# Patient Record
Sex: Male | Born: 1990 | Race: White | Hispanic: No | Marital: Single | Smoking: Current some day smoker
Health system: Southern US, Community
[De-identification: ages and names within clinical notes are randomized; demographics above are authoritative.]

---

## 2015-01-13 ENCOUNTER — Ambulatory Visit
Admit: 2015-01-13 | Disposition: A | Discharge status: Home / Self Care | Payer: Self-pay | Attending: Family Medicine | Admitting: Family Medicine

## 2015-01-13 LAB — RAPID STREP-A WITH REFLX: Micro Text Report: POSITIVE

## 2017-01-20 ENCOUNTER — Encounter (HOSPITAL_COMMUNITY): Payer: Self-pay | Admitting: *Deleted

## 2017-01-20 ENCOUNTER — Emergency Department (HOSPITAL_COMMUNITY)
Admission: EM | Admit: 2017-01-20 | Discharge: 2017-01-20 | Disposition: A | Discharge status: Home / Self Care | Payer: Self-pay | Attending: Emergency Medicine | Admitting: Emergency Medicine

## 2017-01-20 ENCOUNTER — Emergency Department (HOSPITAL_COMMUNITY): Payer: Self-pay

## 2017-01-20 DIAGNOSIS — Y929 Unspecified place or not applicable: Secondary | ICD-10-CM | POA: Insufficient documentation

## 2017-01-20 DIAGNOSIS — X509XXA Other and unspecified overexertion or strenuous movements or postures, initial encounter: Secondary | ICD-10-CM | POA: Insufficient documentation

## 2017-01-20 DIAGNOSIS — Y999 Unspecified external cause status: Secondary | ICD-10-CM | POA: Insufficient documentation

## 2017-01-20 DIAGNOSIS — Y9389 Activity, other specified: Secondary | ICD-10-CM | POA: Insufficient documentation

## 2017-01-20 DIAGNOSIS — S43004A Unspecified dislocation of right shoulder joint, initial encounter: Secondary | ICD-10-CM | POA: Insufficient documentation

## 2017-01-20 DIAGNOSIS — F17200 Nicotine dependence, unspecified, uncomplicated: Secondary | ICD-10-CM | POA: Insufficient documentation

## 2017-01-20 MED ORDER — ETOMIDATE 2 MG/ML IV SOLN
10.0000 mg | Freq: Once | INTRAVENOUS | Status: DC
  Filled 2017-01-20: qty 10

## 2017-01-20 NOTE — ED Triage Notes (Signed)
Pt complains of right shoulder pain since his shoulder "popped out" 20 minutes prior to arrival. Pt dislocated his shoulder 4 days ago and took his arm out of his sling because it was irritating him.

## 2017-01-20 NOTE — ED Provider Notes (Signed)
[facility]-EMERGENCY DEPT Provider Note   CSN: [id number] Arrival date & time: 01/20/17  2002     History   Chief Complaint Chief Complaint  Patient presents with  . Shoulder Injury    HPI Daniel Roach is a 26 y.o. male.  HPI Pt comes in with cc of shoulder injury. Pt reports that he dislocated the shoulder 5 days ago, and removed his arm from the sling as it was bother him. Pt was trying to get up, and put the weight on that extremity and the shoulder popped out. Pt had numbness and tingling initially, but now he only has pain.   History reviewed. No pertinent past medical history.  There are no active problems to display for this patient.   History reviewed. No pertinent surgical history.     Home Medications    Prior to Admission medications   Medication Sig Start Date End Date Taking? Authorizing Provider  HYDROcodone-acetaminophen (NORCO/VICODIN) 5-325 MG tablet Take 2 tablets by mouth every 6 (six) hours as needed for moderate pain or severe pain.   Yes Historical Provider, MD  naproxen (NAPROSYN) 500 MG tablet Take 500 mg by mouth 2 (two) times daily as needed for moderate pain.   Yes Historical Provider, MD    Family History No family history on file.  Social History Social History  Substance Use Topics  . Smoking status: Current Some Day Smoker  . Smokeless tobacco: Never Used  . Alcohol use Yes     Allergies   Amoxicillin   Review of Systems Review of Systems  Constitutional: Positive for activity change.  Musculoskeletal: Positive for arthralgias.  Skin: Negative for wound.  Neurological: Positive for numbness.     Physical Exam Updated Vital Signs BP (!) 157/90 (BP Location: Left Arm)   Pulse 76   Temp 97.9 F (36.6 C) (Oral)   Resp 18   SpO2 98%   Physical Exam  Constitutional: He is oriented to person, place, and time. He appears well-developed.  HENT:  Head: Atraumatic.  Neck: Neck supple.  Cardiovascular: Normal rate  and intact distal pulses.   Pulmonary/Chest: Effort normal.  Neurological: He is alert and oriented to person, place, and time. No cranial nerve deficit.  No numbness or tingling over the R extremity  Skin: Skin is warm.  Nursing note and vitals reviewed.    ED Treatments / Results  Labs (all labs ordered are listed, but only abnormal results are displayed) Labs Reviewed - No data to display  EKG  EKG Interpretation None       Radiology Dg Shoulder Right  Result Date: 01/20/2017 CLINICAL DATA:  Right shoulder dislocation EXAM: RIGHT SHOULDER - 2+ VIEW COMPARISON:  None. FINDINGS: Three views study shows subcoracoid anterior right humeral head dislocation. No associated fracture fragment is evident on the provided films. IMPRESSION: Anterior right shoulder dislocation. Electronically Signed   By: Kennith Center M.D.   On: 01/20/2017 20:28    Procedures Reduction of dislocation Date/Time: 01/20/2017 8:47 PM Performed by: Derwood Kaplan Authorized by: Derwood Kaplan  Consent: Verbal consent obtained. Risks and benefits: risks, benefits and alternatives were discussed Consent given by: patient Patient understanding: patient states understanding of the procedure being performed Required items: required blood products, implants, devices, and special equipment available Patient identity confirmed: verbally with patient and arm band Time out: Immediately prior to procedure a "time out" was called to verify the correct patient, procedure, equipment, support staff and site/side marked as required. Local anesthesia used: no  Anesthesia: Local anesthesia used: no  Sedation: Patient sedated: no Patient tolerance: Patient tolerated the procedure well with no immediate complications    (including critical care time)  Medications Ordered in ED Medications - No data to display   Initial Impression / Assessment and Plan / ED Course  I have reviewed the triage vital signs and the  nursing notes.  Pertinent labs & imaging results that were available during my care of the patient were reviewed by me and considered in my medical decision making (see chart for details).     Pt dislocated his R shoulder. He is neurovascularly intact. Shoulder reduced w/o any need for sedation. Post reduction, pt is still neurovascularly intact. Stable for d//c.   Final Clinical Impressions(s) / ED Diagnoses   Final diagnoses:  Dislocation of right shoulder joint, initial encounter    New Prescriptions New Prescriptions   No medications on file     Derwood Kaplan, MD 01/20/17 2048

## 2017-01-20 NOTE — Discharge Instructions (Signed)
Keep your shoulder in the sling all the time. See Orthopedist as requested - you will need some physical therapy to strengthen the shoulder.  Ice the shoulder tonight.

## 2018-08-08 IMAGING — CR DG SHOULDER 2+V*R*
3 series · 3 of 3 positions shown · non-contrast
Comparison: None.

CLINICAL DATA: Right shoulder dislocation

EXAM:
RIGHT SHOULDER - 2+ VIEW

[x shoulder ap right (1 of 2)]
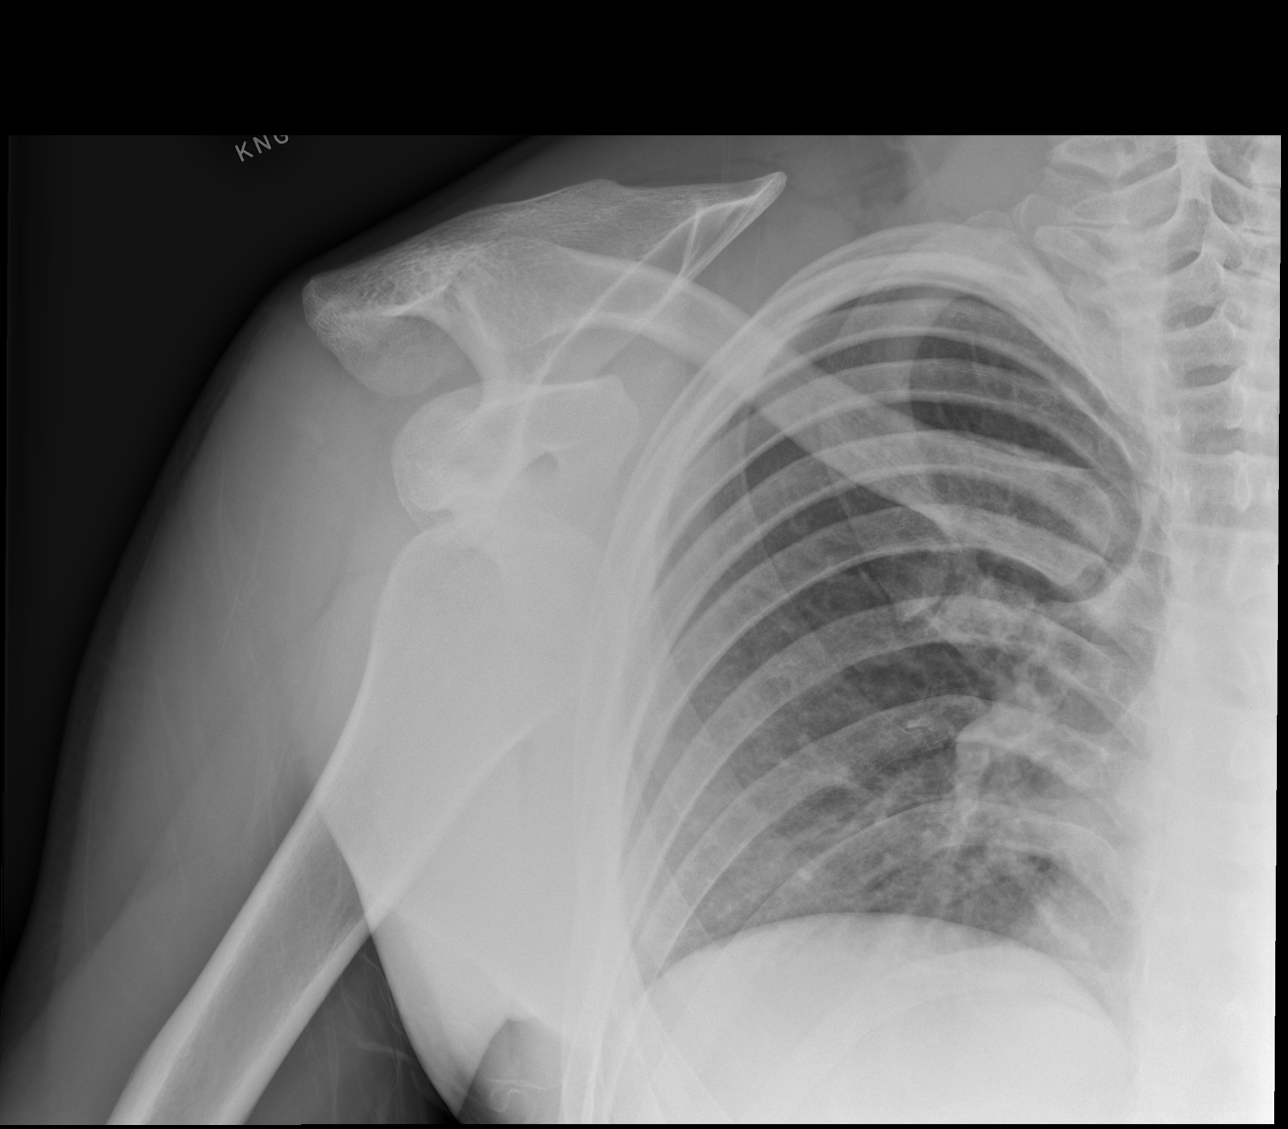

[x shoulder ap right (2 of 2)]
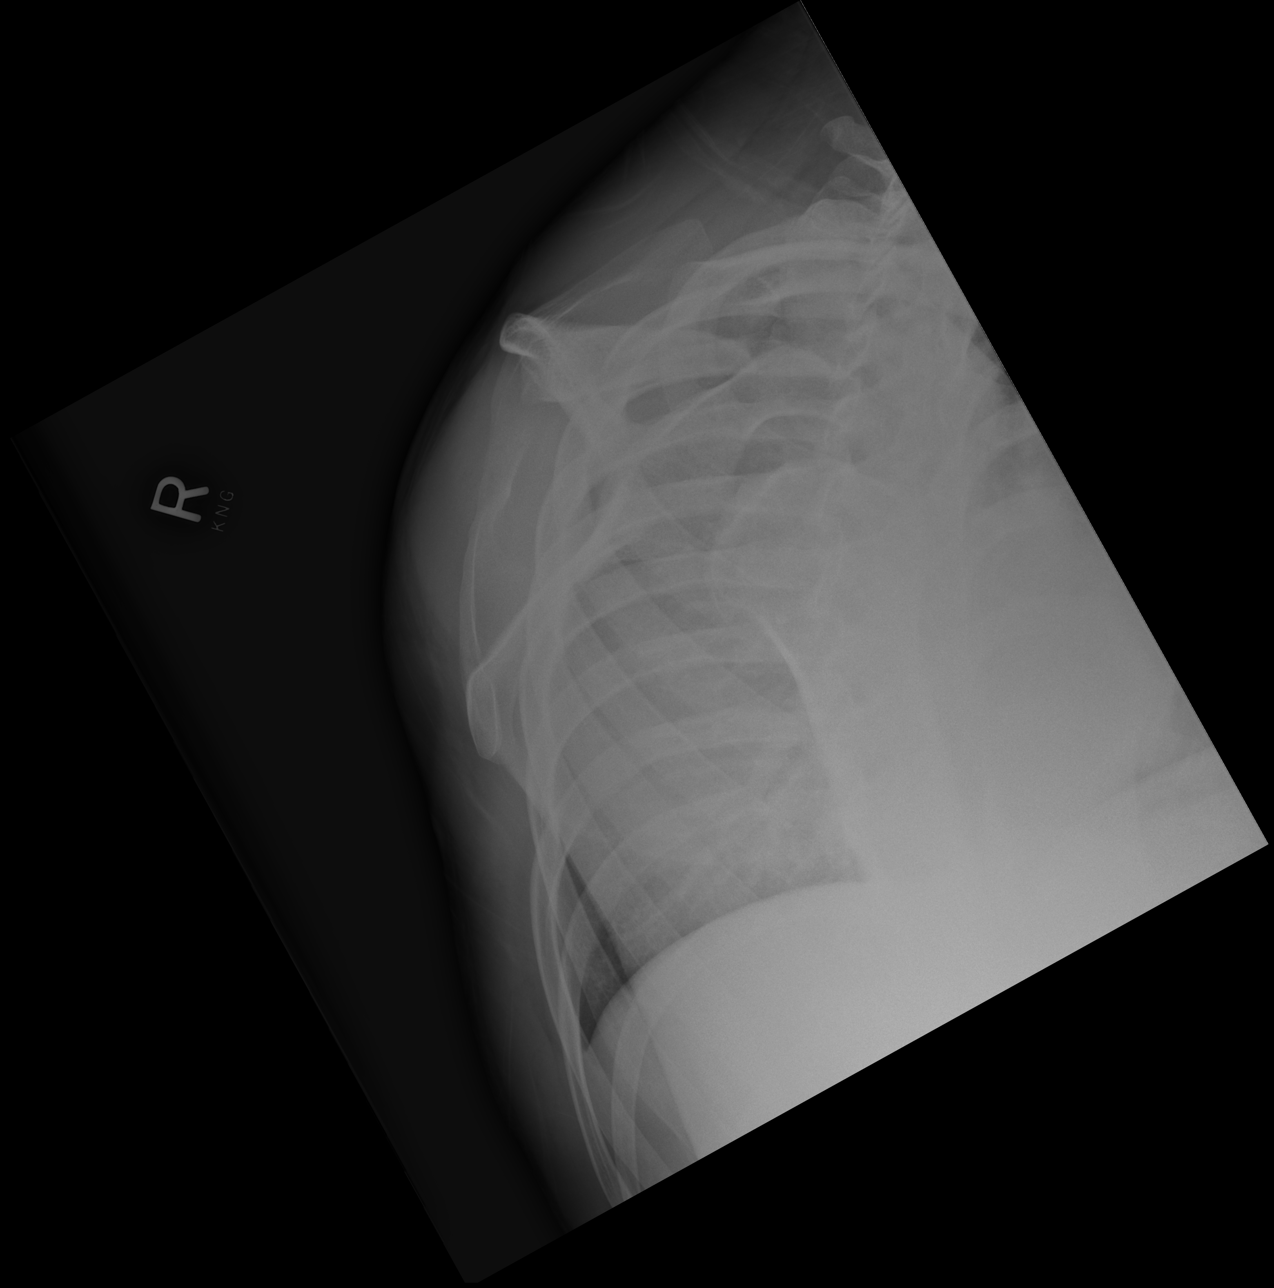

[x shoulder axillary right]
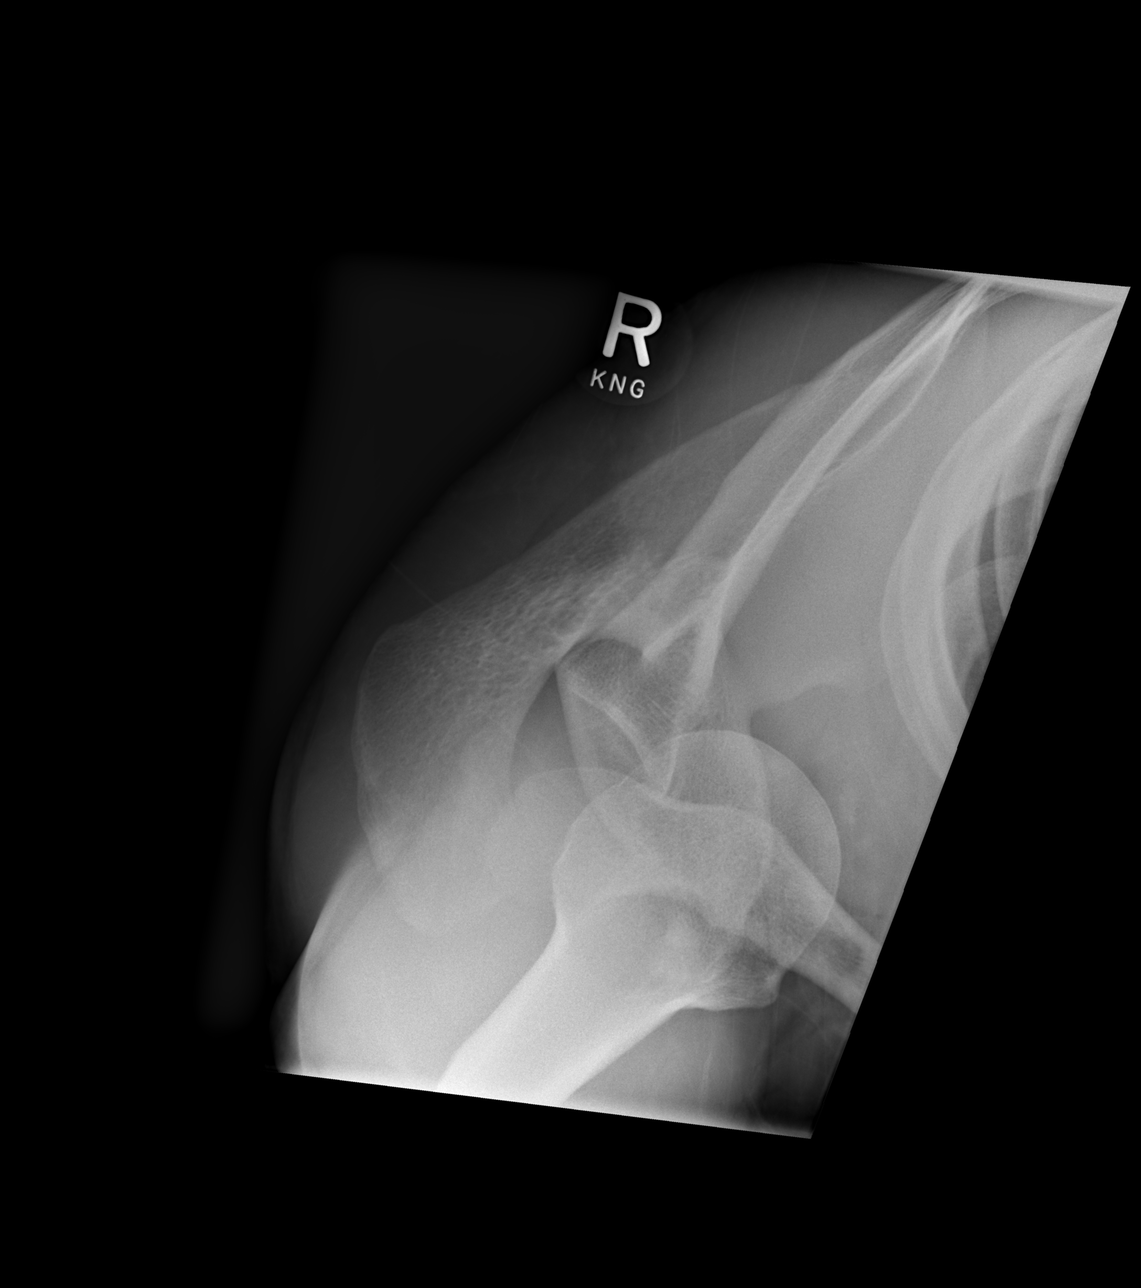

[3 of 3 positions shown; findings below may reference images not displayed]

FINDINGS: Three views study shows subcoracoid anterior right humeral head
dislocation. No associated fracture fragment is evident on the
provided films.
IMPRESSION: Anterior right shoulder dislocation.

## 2020-12-03 DIAGNOSIS — R1031 Right lower quadrant pain: Secondary | ICD-10-CM | POA: Diagnosis not present

## 2020-12-03 DIAGNOSIS — Z6827 Body mass index (BMI) 27.0-27.9, adult: Secondary | ICD-10-CM | POA: Diagnosis not present

## 2020-12-07 DIAGNOSIS — R1031 Right lower quadrant pain: Secondary | ICD-10-CM | POA: Diagnosis not present

## 2020-12-08 DIAGNOSIS — N433 Hydrocele, unspecified: Secondary | ICD-10-CM | POA: Diagnosis not present

## 2021-01-21 DIAGNOSIS — R1031 Right lower quadrant pain: Secondary | ICD-10-CM | POA: Diagnosis not present

## 2021-01-21 DIAGNOSIS — Z6827 Body mass index (BMI) 27.0-27.9, adult: Secondary | ICD-10-CM | POA: Diagnosis not present

## 2021-01-29 DIAGNOSIS — K388 Other specified diseases of appendix: Secondary | ICD-10-CM | POA: Diagnosis not present

## 2021-01-29 DIAGNOSIS — K37 Unspecified appendicitis: Secondary | ICD-10-CM | POA: Diagnosis not present

## 2021-01-29 DIAGNOSIS — R1031 Right lower quadrant pain: Secondary | ICD-10-CM | POA: Diagnosis not present

## 2021-03-11 DIAGNOSIS — M25311 Other instability, right shoulder: Secondary | ICD-10-CM | POA: Diagnosis not present

## 2021-03-11 DIAGNOSIS — Z6827 Body mass index (BMI) 27.0-27.9, adult: Secondary | ICD-10-CM | POA: Diagnosis not present

## 2021-03-11 DIAGNOSIS — S43004S Unspecified dislocation of right shoulder joint, sequela: Secondary | ICD-10-CM | POA: Diagnosis not present

## 2021-04-21 DIAGNOSIS — S42291A Other displaced fracture of upper end of right humerus, initial encounter for closed fracture: Secondary | ICD-10-CM | POA: Diagnosis not present

## 2021-04-21 DIAGNOSIS — S43004D Unspecified dislocation of right shoulder joint, subsequent encounter: Secondary | ICD-10-CM | POA: Diagnosis not present

## 2021-04-21 DIAGNOSIS — M25311 Other instability, right shoulder: Secondary | ICD-10-CM | POA: Diagnosis not present

## 2021-05-06 DIAGNOSIS — S42141A Displaced fracture of glenoid cavity of scapula, right shoulder, initial encounter for closed fracture: Secondary | ICD-10-CM | POA: Diagnosis not present

## 2021-05-06 DIAGNOSIS — G8918 Other acute postprocedural pain: Secondary | ICD-10-CM | POA: Diagnosis not present

## 2021-05-06 DIAGNOSIS — S42141S Displaced fracture of glenoid cavity of scapula, right shoulder, sequela: Secondary | ICD-10-CM | POA: Diagnosis not present

## 2021-05-06 DIAGNOSIS — S43431S Superior glenoid labrum lesion of right shoulder, sequela: Secondary | ICD-10-CM | POA: Diagnosis not present

## 2021-05-06 DIAGNOSIS — S43431A Superior glenoid labrum lesion of right shoulder, initial encounter: Secondary | ICD-10-CM | POA: Diagnosis not present

## 2021-05-06 DIAGNOSIS — M25311 Other instability, right shoulder: Secondary | ICD-10-CM | POA: Diagnosis not present

## 2021-05-06 DIAGNOSIS — S42151S Displaced fracture of neck of scapula, right shoulder, sequela: Secondary | ICD-10-CM | POA: Diagnosis not present

## 2021-09-22 DIAGNOSIS — B353 Tinea pedis: Secondary | ICD-10-CM | POA: Diagnosis not present

## 2021-09-22 DIAGNOSIS — Z6828 Body mass index (BMI) 28.0-28.9, adult: Secondary | ICD-10-CM | POA: Diagnosis not present

## 2021-09-22 DIAGNOSIS — L302 Cutaneous autosensitization: Secondary | ICD-10-CM | POA: Diagnosis not present
